# Patient Record
Sex: Male | Born: 1985 | Race: White | Hispanic: No | Marital: Single | Smoking: Former smoker
Health system: Southern US, Community
[De-identification: ages and names within clinical notes are randomized; demographics above are authoritative.]

---

## 2015-01-14 ENCOUNTER — Emergency Department (HOSPITAL_COMMUNITY)

## 2015-01-14 ENCOUNTER — Emergency Department (HOSPITAL_COMMUNITY)
Admission: EM | Admit: 2015-01-14 | Discharge: 2015-01-14 | Disposition: A | Discharge status: Home / Self Care | Attending: Emergency Medicine | Admitting: Emergency Medicine

## 2015-01-14 ENCOUNTER — Encounter (HOSPITAL_COMMUNITY): Payer: Self-pay | Admitting: Emergency Medicine

## 2015-01-14 DIAGNOSIS — Z87891 Personal history of nicotine dependence: Secondary | ICD-10-CM | POA: Diagnosis not present

## 2015-01-14 DIAGNOSIS — S60031A Contusion of right middle finger without damage to nail, initial encounter: Secondary | ICD-10-CM | POA: Insufficient documentation

## 2015-01-14 DIAGNOSIS — S60414A Abrasion of right ring finger, initial encounter: Secondary | ICD-10-CM | POA: Insufficient documentation

## 2015-01-14 DIAGNOSIS — S67194A Crushing injury of right ring finger, initial encounter: Secondary | ICD-10-CM | POA: Diagnosis not present

## 2015-01-14 DIAGNOSIS — S60041A Contusion of right ring finger without damage to nail, initial encounter: Secondary | ICD-10-CM | POA: Insufficient documentation

## 2015-01-14 DIAGNOSIS — Y9389 Activity, other specified: Secondary | ICD-10-CM | POA: Insufficient documentation

## 2015-01-14 DIAGNOSIS — Z23 Encounter for immunization: Secondary | ICD-10-CM | POA: Insufficient documentation

## 2015-01-14 DIAGNOSIS — S67192A Crushing injury of right middle finger, initial encounter: Secondary | ICD-10-CM | POA: Diagnosis not present

## 2015-01-14 DIAGNOSIS — S6710XA Crushing injury of unspecified finger(s), initial encounter: Secondary | ICD-10-CM

## 2015-01-14 DIAGNOSIS — Y998 Other external cause status: Secondary | ICD-10-CM | POA: Insufficient documentation

## 2015-01-14 DIAGNOSIS — Y9289 Other specified places as the place of occurrence of the external cause: Secondary | ICD-10-CM | POA: Insufficient documentation

## 2015-01-14 DIAGNOSIS — S6991XA Unspecified injury of right wrist, hand and finger(s), initial encounter: Secondary | ICD-10-CM | POA: Diagnosis present

## 2015-01-14 DIAGNOSIS — W208XXA Other cause of strike by thrown, projected or falling object, initial encounter: Secondary | ICD-10-CM | POA: Insufficient documentation

## 2015-01-14 MED ORDER — IBUPROFEN 800 MG PO TABS
800.0000 mg | ORAL_TABLET | Freq: Once | ORAL | Status: AC
  Administered 2015-01-14: 800 mg via ORAL
  Filled 2015-01-14: qty 1

## 2015-01-14 MED ORDER — TETANUS-DIPHTH-ACELL PERTUSSIS 5-2.5-18.5 LF-MCG/0.5 IM SUSP
0.5000 mL | Freq: Once | INTRAMUSCULAR | Status: AC
  Administered 2015-01-14: 0.5 mL via INTRAMUSCULAR
  Filled 2015-01-14: qty 0.5

## 2015-01-14 MED ORDER — TRAMADOL HCL 50 MG PO TABS: 50.0000 mg | ORAL_TABLET | Freq: Four times a day (QID) | ORAL | Status: AC | PRN

## 2015-01-14 MED ORDER — BACITRACIN ZINC 500 UNIT/GM EX OINT
TOPICAL_OINTMENT | CUTANEOUS | Status: AC
  Filled 2015-01-14: qty 0.9

## 2015-01-14 MED ORDER — LIDOCAINE-EPINEPHRINE (PF) 2 %-1:200000 IJ SOLN: 10.0000 mL | Freq: Once | INTRAMUSCULAR | Status: DC

## 2015-01-14 MED ORDER — TRAMADOL HCL 50 MG PO TABS
50.0000 mg | ORAL_TABLET | Freq: Once | ORAL | Status: AC
Start: 2015-01-14 — End: 2015-01-14
  Administered 2015-01-14: 50 mg via ORAL
  Filled 2015-01-14: qty 1

## 2015-01-14 MED ORDER — IBUPROFEN 600 MG PO TABS: 600.0000 mg | ORAL_TABLET | Freq: Four times a day (QID) | ORAL | Status: AC | PRN

## 2015-01-14 NOTE — ED Notes (Signed)
Pt states that he had a 35lb weight fall on his right middle and ring fingers.

## 2015-01-14 NOTE — ED Provider Notes (Signed)
CSN: [id number]     Arrival date & time 01/14/15  1642 History  By signing my name below, I, Lyndel Safe, attest that this documentation has been prepared under the direction and in the presence of Burgess Amor, PA-C. Electronically Signed: Lyndel Safe, ED Scribe. 01/14/2015. 6:12 PM.    Chief Complaint  Patient presents with  . Hand Injury   The history is provided by the patient. No language interpreter was used.   HPI Comments: Bobby Estrada is a 29 y.o. male who presents to the Emergency Department from a correctional facility with attending officer complaining of sudden onset, constant pain and swelling to right 3rd and 4th fingers onset 3 hours ago after a 35lb weight fell onto his right hand. There is associated mild ecchymosis to dorsum of affected fingers and a small abrasion to dorsum of mid right 4th finger; bleeding is controlled. Pain is worse with flexion and extension of affected fingers. Last tetanus unknown; pt denies receiving sutures in the recent past. Denies numbness or weakness in right hand. Per officer, there will be a medical provider in the correctional facility tomorrow that the pt can follow up with.    History reviewed. No pertinent past medical history. History reviewed. No pertinent past surgical history. History reviewed. No pertinent family history. Social History  Substance Use Topics  . Smoking status: Former Games developer  . Smokeless tobacco: None  . Alcohol Use: No    Review of Systems  Musculoskeletal: Positive for joint swelling ( right 3rd and 4th fingers ) and arthralgias (right 3rd and 4th fingers ).  Skin: Positive for color change ( ecchymosis to 3rd and 4th fingers) and wound ( small abrasion to dorsum of 4th finger).  Neurological: Negative for weakness and numbness.   Allergies  Review of patient's allergies indicates no known allergies.  Home Medications   Prior to Admission medications   Medication Sig Start Date End Date Taking?  Authorizing Provider  ibuprofen (ADVIL,MOTRIN) 600 MG tablet Take 1 tablet (600 mg total) by mouth every 6 (six) hours as needed. 01/14/15   Burgess Amor, PA-C  traMADol (ULTRAM) 50 MG tablet Take 1 tablet (50 mg total) by mouth every 6 (six) hours as needed. 01/14/15   Burgess Amor, PA-C   BP 117/78 mmHg  Pulse 77  Temp(Src) 97.8 F (36.6 C) (Oral)  Resp 18  Ht  (1.727 m)  Wt 175 lb (79.379 kg)  BMI 26.61 kg/m2  SpO2 100% Physical Exam  Constitutional: He appears well-developed and well-nourished.  HENT:  Head: Atraumatic.  Neck: Normal range of motion.  Cardiovascular:  Pulses equal bilaterally  Musculoskeletal: He exhibits edema and tenderness.  Right hand; swelling and contusion to right long and ring fingers, there is a superficial abrasion to the dorsal mid phalanx of ring finger which is hemostatic, distal cap refill less than 2 seconds, sensation is full in long finger and present but reduced in ring finger, DIP joint of ring finger has a popping sensation and dorsal movement when dorsal strain of dp.    Neurological: He is alert. He has normal strength. He displays normal reflexes. No sensory deficit.  Skin: Skin is warm and dry.  Psychiatric: He has a normal mood and affect.    ED Course  Procedures  DIAGNOSTIC STUDIES: Oxygen Saturation is 100% on RA, normal by my interpretation.    COORDINATION OF CARE: 6:03 PM Discussed treatment plan with pt at bedside and pt agreed to plan. Discussed Xray of right  hand with pt. Will order pain management medication, splint affected fingers, and give referral for pt to follow up with hand specialist. Tetanus injection updated.   Imaging Review Dg Hand Complete Right  01/14/2015   CLINICAL DATA:  Status post 35 lb weight dropped on patient's right hand.  EXAM: RIGHT HAND - COMPLETE 3+ VIEW  COMPARISON:  None.  FINDINGS: No evidence of acute fracture or subluxation. No focal bone lesion or bone destruction. Bone cortex and trabecular  architecture appear intact. No radiopaque soft tissue foreign bodies.  IMPRESSION: Negative.   Electronically Signed   By: Ted Mcalpine M.D.   On: 01/14/2015 17:17   I have personally reviewed and evaluated these images results as part of my medical decision-making.   MDM   Final diagnoses:  Crush injury to finger, initial encounter      Radiological studies were viewed, interpreted and considered during the medical decision making and disposition process. I agree with radiologists reading.  Results were also discussed with patient.   Exam concerning for possible cartilage and/or dorsal ligament injury of right 5th dip joint.  He was placed in a finger splint, advised ice, elevation, ibuprofen and tramadol prescribed for pain relief.  Referral to hand specialist for further eval /management.    I personally performed the services described in this documentation, which was scribed in my presence. The recorded information has been reviewed and is accurate.   Burgess Amor, PA-C 01/16/15 1318  Rolland Porter, MD 01/17/15 2325

## 2015-01-14 NOTE — Discharge Instructions (Signed)
Crush Injury, Fingers or Toes A crush injury to the fingers or toes means the tissues have been damaged by being squeezed (compressed). There will be bleeding into the tissues and swelling. Often, blood will collect under the skin. When this happens, the skin on the finger often dies and may slough off (shed) 1 week to 10 days later. Usually, new skin is growing underneath. If the injury has been too severe and the tissue does not survive, the damaged tissue may begin to turn black over several days.  Wounds which occur because of the crushing may be stitched (sutured) shut. However, crush injuries are more likely to become infected than other injuries.These wounds may not be closed as tightly as other types of cuts to prevent infection. Nails involved are often lost. These usually grow back over several weeks.  DIAGNOSIS X-rays may be taken to see if there is any injury to the bones. TREATMENT Broken bones (fractures) may be treated with splinting, depending on the fracture. Often, no treatment is required for fractures of the last bone in the fingers or toes. HOME CARE INSTRUCTIONS   The crushed part should be raised (elevated) above the heart or center of the chest as much as possible for the first several days or as directed. This helps with pain and lessens swelling. Less swelling increases the chances that the crushed part will survive.  Put ice on the injured area.  Put ice in a plastic bag.  Place a towel between your skin and the bag.  Leave the ice on for 15-20 minutes, 03-04 times a day for the first 2 days.  Only take over-the-counter or prescription medicines for pain, discomfort, or fever as directed by your caregiver.  Use your injured part only as directed.  Change your bandages (dressings) as directed.  Keep all follow-up appointments as directed by your caregiver. Not keeping your appointment could result in a chronic or permanent injury, pain, and disability. If there is  any problem keeping the appointment, you must call to reschedule. SEEK IMMEDIATE MEDICAL CARE IF:   There is redness, swelling, or increasing pain in the wound area.  Pus is coming from the wound.  You have a fever.  You notice a bad smell coming from the wound or dressing.  The edges of the wound do not stay together after the sutures have been removed.  You are unable to move the injured finger or toe. MAKE SURE YOU:   Understand these instructions.  Will watch your condition.  Will get help right away if you are not doing well or get worse.   This information is not intended to replace advice given to you by your health care provider. Make sure you discuss any questions you have with your health care provider.   Document Released: 03/24/2005 Document Revised: 06/16/2011 Document Reviewed: 08/09/2010 [employer] Interactive Patient Education [employer]   As discussed,  Your exam is suspicious for possible ligament or cartilage injury to the distal joint of your ring finger and you should obtain follow up care with a hand specialist.  Call Dr. Amanda Pea for an appointment this week.  In the interim,  Use the splint for comfort.  Ice and elevation will help with pain and swelling.

## 2016-09-14 IMAGING — DX DG HAND COMPLETE 3+V*R*
3 series · 3 of 3 positions shown · non-contrast
Comparison: None.

CLINICAL DATA: Status post 35 lb weight dropped on patient's right
hand.

EXAM:
RIGHT HAND - COMPLETE 3+ VIEW

[hand pa]
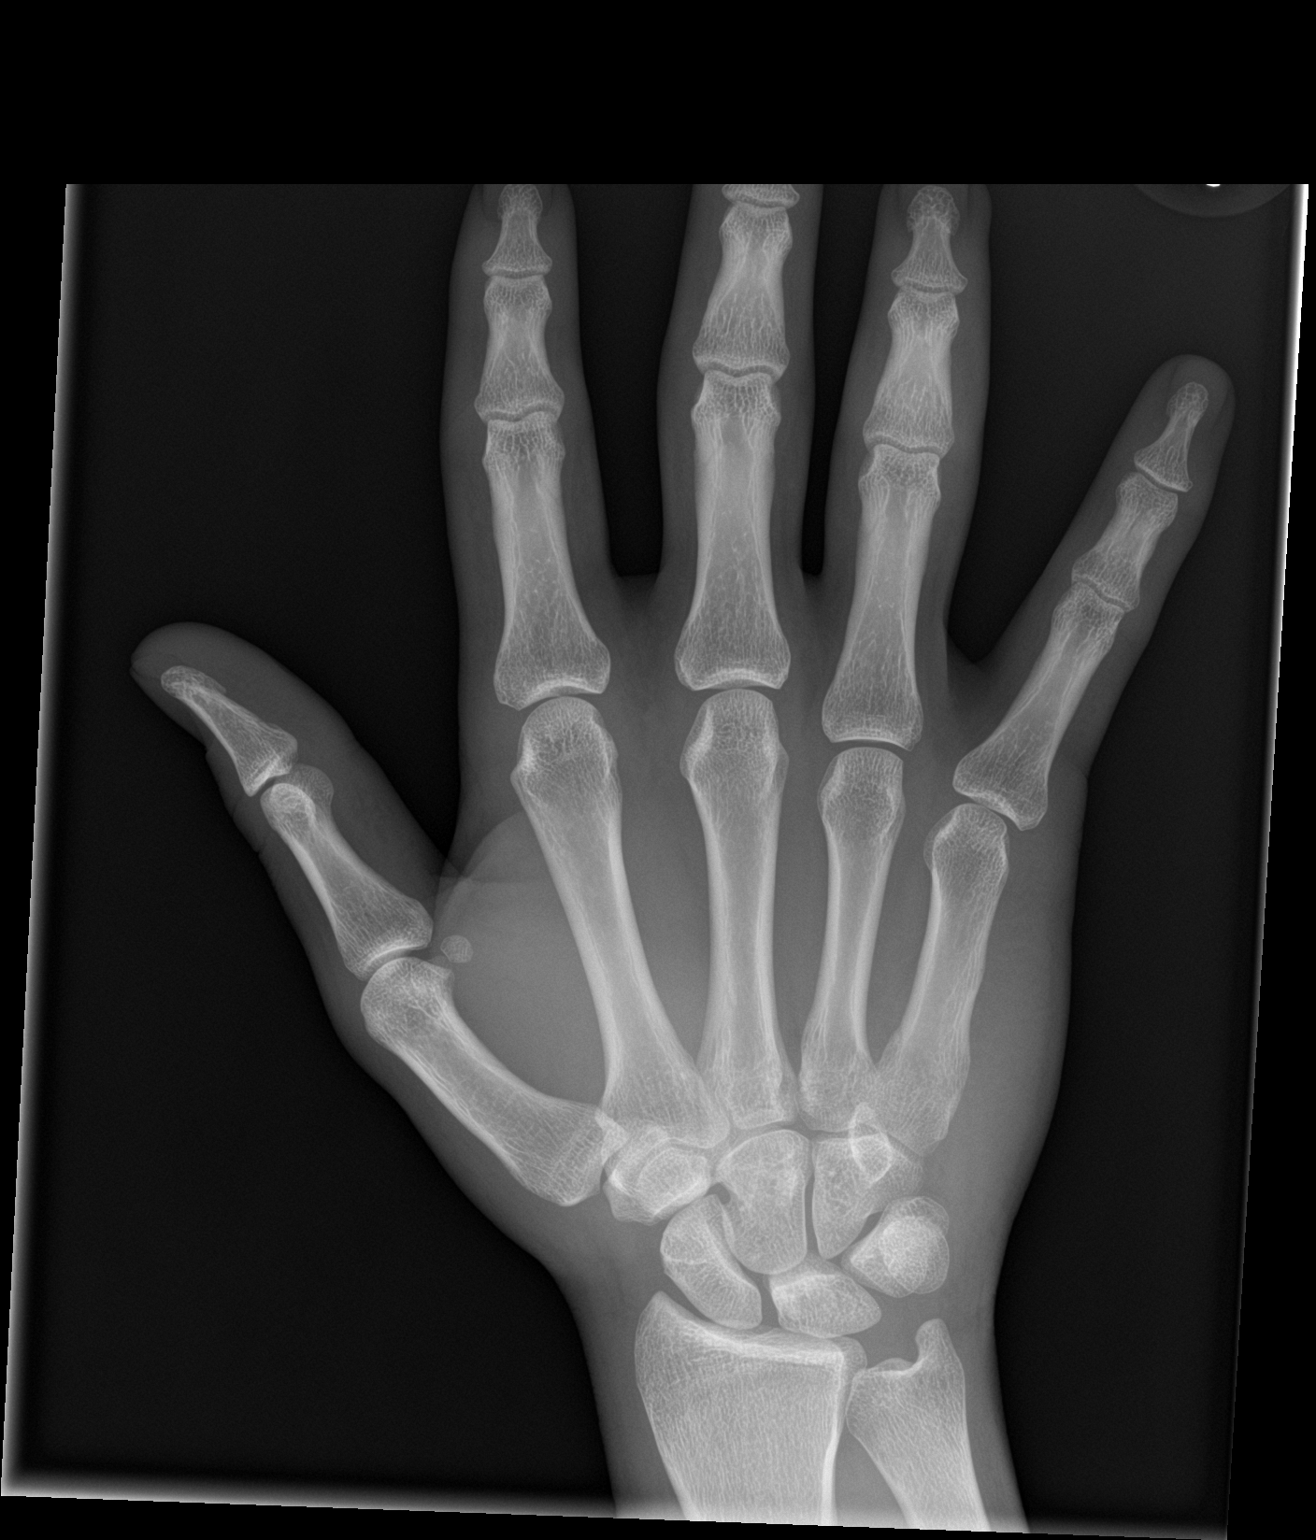

[hand obl]
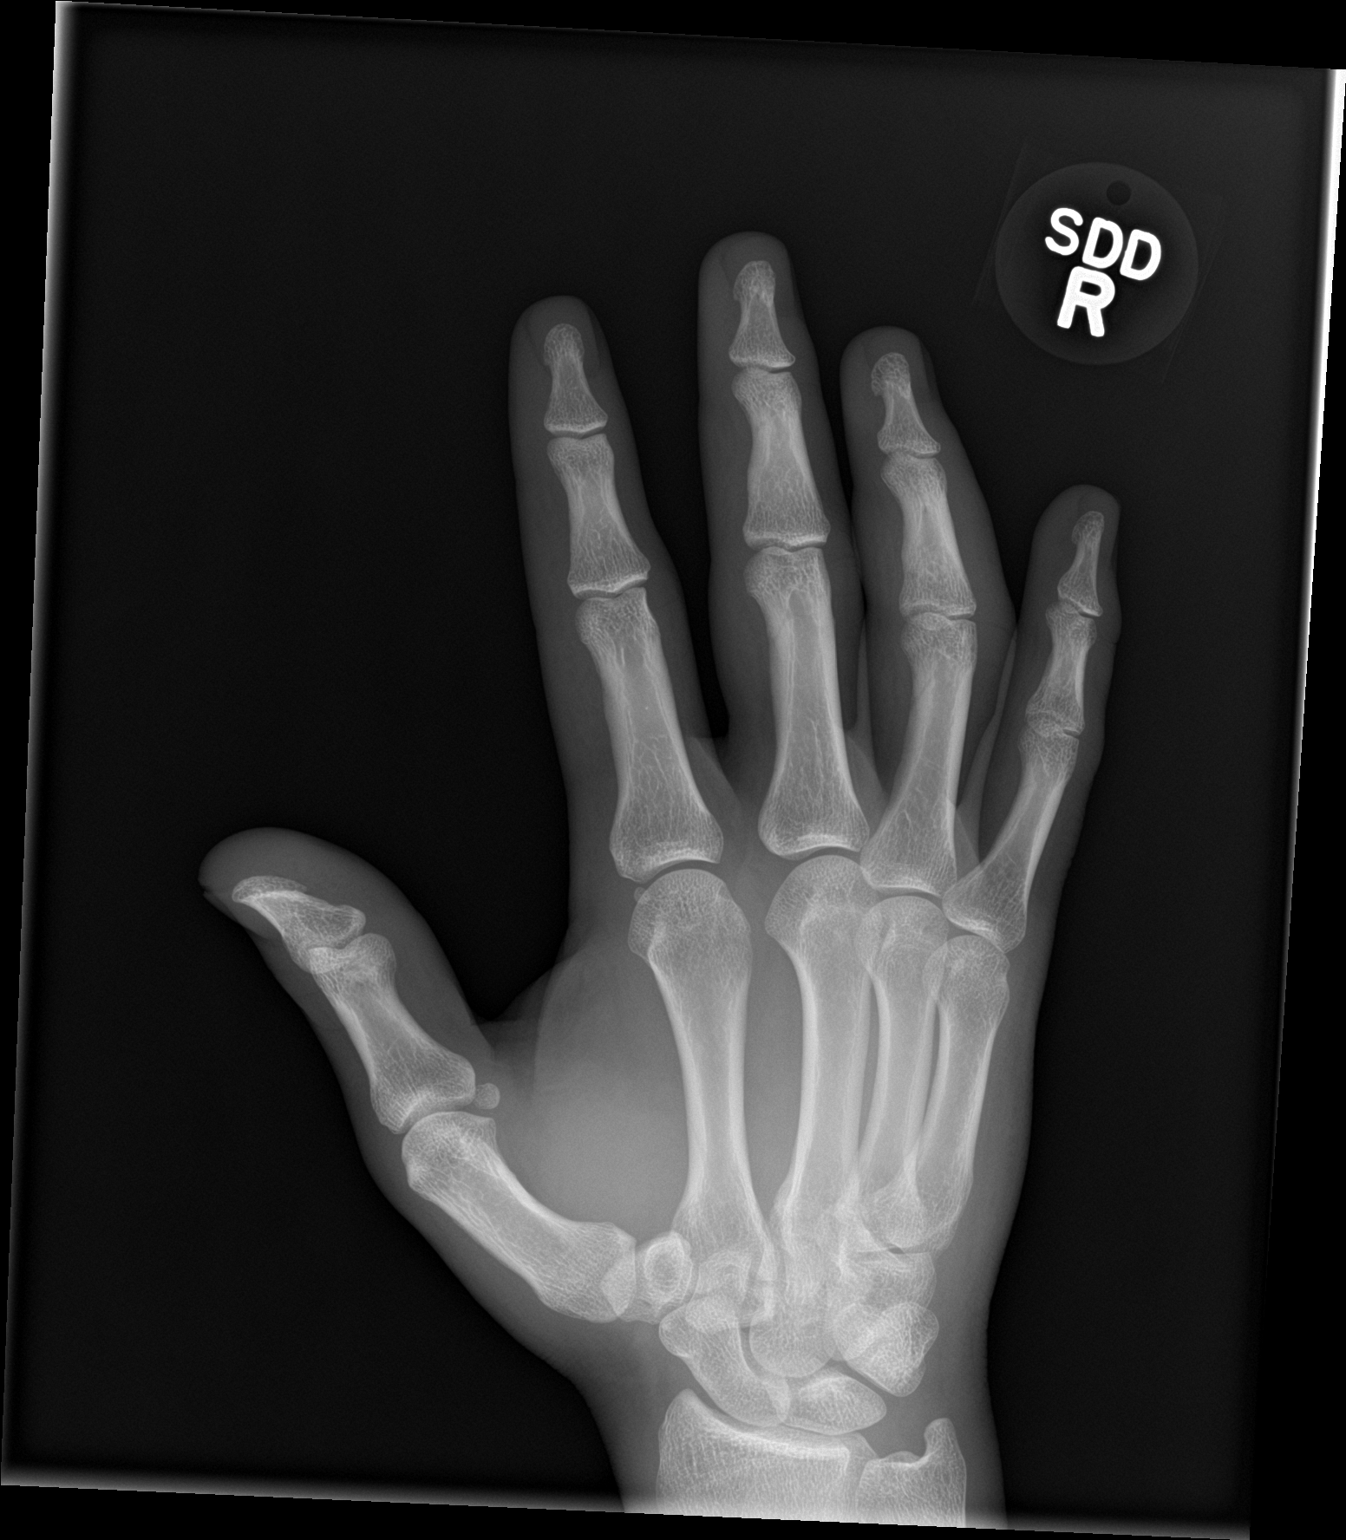

[hand lat]
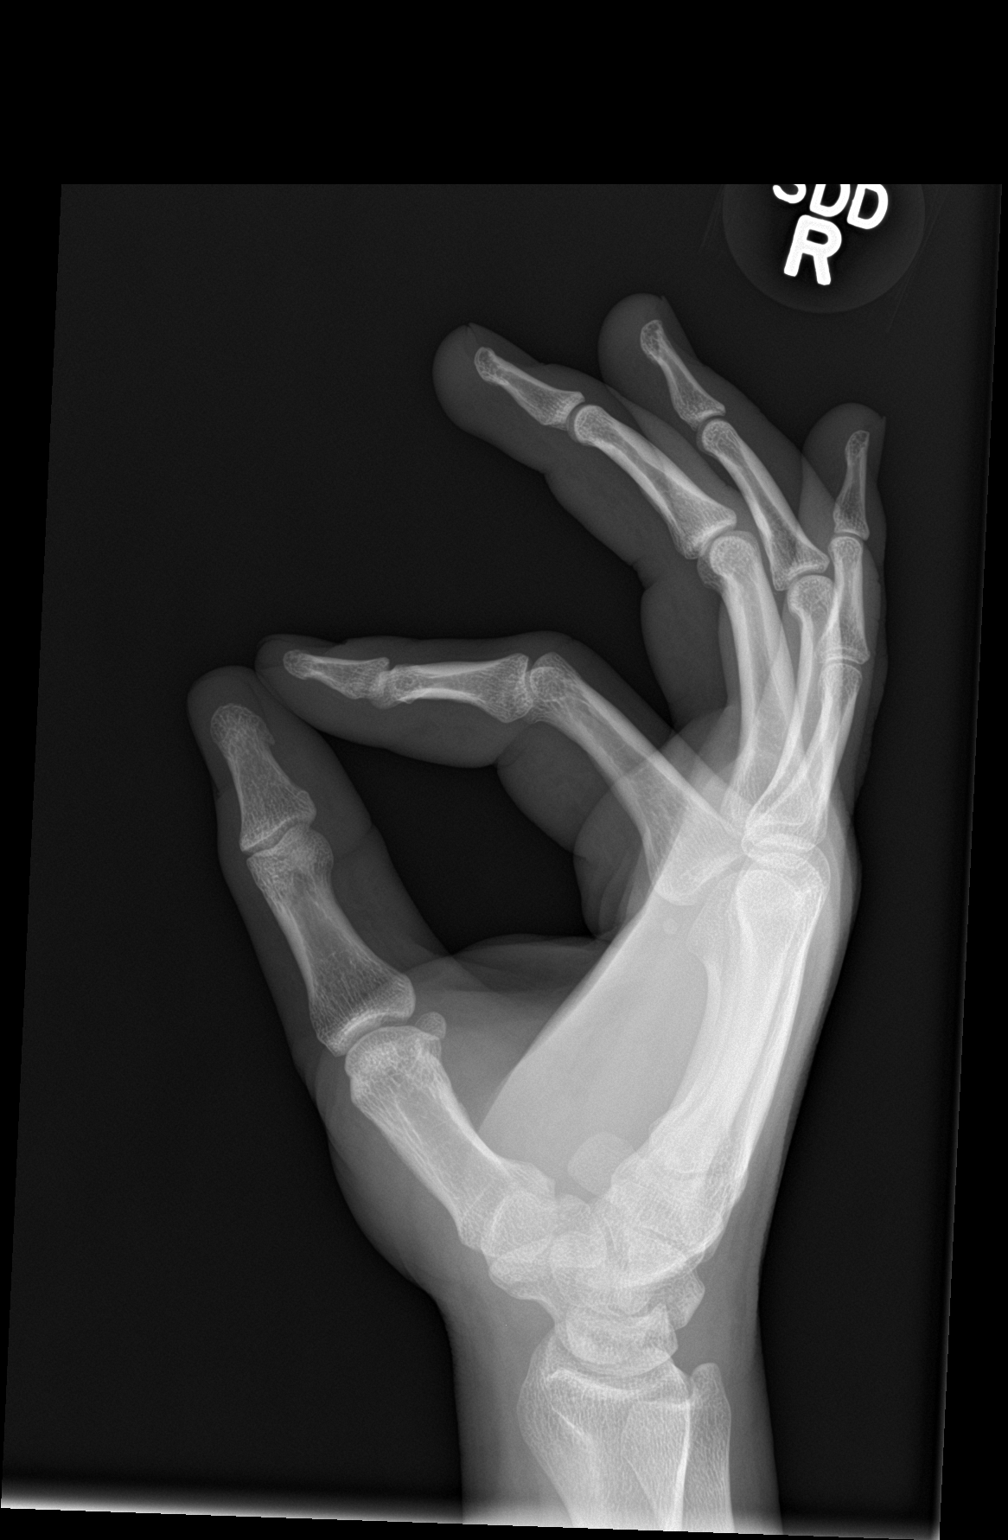

[3 of 3 positions shown; findings below may reference images not displayed]

FINDINGS: No evidence of acute fracture or subluxation. No focal bone lesion
or bone destruction. Bone cortex and trabecular architecture appear
intact. No radiopaque soft tissue foreign bodies.
IMPRESSION: Negative.
